# Patient Record
Sex: Male | Born: 1970 | Marital: Married | Smoking: Never smoker
Health system: Southern US, Community
[De-identification: ages and names within clinical notes are randomized; demographics above are authoritative.]

---

## 2014-02-26 ENCOUNTER — Encounter (HOSPITAL_COMMUNITY): Payer: Self-pay | Admitting: Emergency Medicine

## 2014-02-26 ENCOUNTER — Emergency Department: Payer: Self-pay | Admitting: Emergency Medicine

## 2014-02-26 DIAGNOSIS — R091 Pleurisy: Secondary | ICD-10-CM | POA: Diagnosis present

## 2014-02-26 DIAGNOSIS — Z86718 Personal history of other venous thrombosis and embolism: Secondary | ICD-10-CM

## 2014-02-26 DIAGNOSIS — R000 Tachycardia, unspecified: Secondary | ICD-10-CM | POA: Diagnosis present

## 2014-02-26 DIAGNOSIS — Z720 Tobacco use: Secondary | ICD-10-CM

## 2014-02-26 DIAGNOSIS — J9811 Atelectasis: Secondary | ICD-10-CM | POA: Diagnosis present

## 2014-02-26 DIAGNOSIS — R0902 Hypoxemia: Secondary | ICD-10-CM | POA: Diagnosis present

## 2014-02-26 DIAGNOSIS — I2699 Other pulmonary embolism without acute cor pulmonale: Principal | ICD-10-CM | POA: Diagnosis present

## 2014-02-26 LAB — COMPREHENSIVE METABOLIC PANEL
ALT: 18 U/L
AST: 18 U/L (ref 15–37)
Albumin: 3.7 g/dL (ref 3.4–5.0)
Alkaline Phosphatase: 63 U/L
Anion Gap: 8 (ref 7–16)
BILIRUBIN TOTAL: 0.5 mg/dL (ref 0.2–1.0)
BUN: 11 mg/dL (ref 7–18)
CO2: 27 mmol/L (ref 21–32)
CREATININE: 0.92 mg/dL (ref 0.60–1.30)
Calcium, Total: 8.8 mg/dL (ref 8.5–10.1)
Chloride: 103 mmol/L (ref 98–107)
EGFR (African American): >60
EGFR (Non-African Amer.): >60
Glucose: 95 mg/dL (ref 65–99)
OSMOLALITY: 275 (ref 275–301)
Potassium: 3.9 mmol/L (ref 3.5–5.1)
SODIUM: 138 mmol/L (ref 136–145)
Total Protein: 7.8 g/dL (ref 6.4–8.2)

## 2014-02-26 LAB — CBC
HCT: 42.9 % (ref 40.0–52.0)
HGB: 13.9 g/dL (ref 13.0–18.0)
MCH: 30.6 pg (ref 26.0–34.0)
MCHC: 32.4 g/dL (ref 32.0–36.0)
MCV: 95 fL (ref 80–100)
Platelet: 285 10*3/uL (ref 150–440)
RBC: 4.53 10*6/uL (ref 4.40–5.90)
RDW: 13 % (ref 11.5–14.5)
WBC: 11.5 10*3/uL — AB (ref 3.8–10.6)

## 2014-02-26 LAB — TROPONIN I

## 2014-02-26 LAB — APTT: ACTIVATED PTT: 28.5 s (ref 23.6–35.9)

## 2014-02-26 LAB — PROTIME-INR
INR: 1.1
Prothrombin Time: 13.8 secs (ref 11.5–14.7)

## 2014-02-26 NOTE — ED Notes (Signed)
Patient arrives with ongoing chest pain after being discharged from [facility]. States that he was there for about 8 hours and was extensively evaluated for the chest pain, but it continues and has gotten worse. Endorses previous history of DVT, and patient is a Naval architecttruck driver. Denies lower extremity pain and explains that a CT-A was completed by [facility] during his visit to rule out PE. States that pain increases with walking, deep breath, and laying flat. Palpation does not increase pain.

## 2014-02-27 ENCOUNTER — Encounter (HOSPITAL_COMMUNITY): Payer: Self-pay | Admitting: Internal Medicine

## 2014-02-27 ENCOUNTER — Inpatient Hospital Stay (HOSPITAL_COMMUNITY): Payer: Self-pay

## 2014-02-27 ENCOUNTER — Inpatient Hospital Stay (HOSPITAL_COMMUNITY)
Admission: EM | Admit: 2014-02-27 | Discharge: 2014-03-01 | DRG: 176 | Disposition: A | Discharge status: Home / Self Care | Payer: Self-pay | Attending: Internal Medicine | Admitting: Internal Medicine

## 2014-02-27 DIAGNOSIS — R000 Tachycardia, unspecified: Secondary | ICD-10-CM

## 2014-02-27 DIAGNOSIS — R091 Pleurisy: Secondary | ICD-10-CM

## 2014-02-27 DIAGNOSIS — R0902 Hypoxemia: Secondary | ICD-10-CM

## 2014-02-27 DIAGNOSIS — IMO0001 Reserved for inherently not codable concepts without codable children: Secondary | ICD-10-CM

## 2014-02-27 DIAGNOSIS — F17200 Nicotine dependence, unspecified, uncomplicated: Secondary | ICD-10-CM | POA: Diagnosis present

## 2014-02-27 DIAGNOSIS — Z86718 Personal history of other venous thrombosis and embolism: Secondary | ICD-10-CM

## 2014-02-27 DIAGNOSIS — J811 Chronic pulmonary edema: Secondary | ICD-10-CM | POA: Insufficient documentation

## 2014-02-27 DIAGNOSIS — I2699 Other pulmonary embolism without acute cor pulmonale: Principal | ICD-10-CM

## 2014-02-27 LAB — APTT
APTT: 56 s — AB (ref 24–37)
aPTT: 45 seconds — ABNORMAL HIGH (ref 24–37)

## 2014-02-27 LAB — BASIC METABOLIC PANEL
Anion gap: 13 (ref 5–15)
BUN: 15 mg/dL (ref 6–23)
CO2: 26 mEq/L (ref 19–32)
Calcium: 9 mg/dL (ref 8.4–10.5)
Chloride: 99 mEq/L (ref 96–112)
Creatinine, Ser: 0.87 mg/dL (ref 0.50–1.35)
GFR calc Af Amer: >90 mL/min (ref >90)
GFR calc non Af Amer: >90 mL/min (ref >90)
Glucose, Bld: 114 mg/dL — ABNORMAL HIGH (ref 70–99)
Potassium: 4.9 mEq/L (ref 3.7–5.3)
Sodium: 138 mEq/L (ref 137–147)

## 2014-02-27 LAB — CBC
HCT: 39.2 % (ref 39.0–52.0)
Hemoglobin: 13.4 g/dL (ref 13.0–17.0)
MCH: 31.5 pg (ref 26.0–34.0)
MCHC: 34.2 g/dL (ref 30.0–36.0)
MCV: 92 fL (ref 78.0–100.0)
Platelets: 285 10*3/uL (ref 150–400)
RBC: 4.26 MIL/uL (ref 4.22–5.81)
RDW: 12.9 % (ref 11.5–15.5)
WBC: 12.7 10*3/uL — ABNORMAL HIGH (ref 4.0–10.5)

## 2014-02-27 LAB — BLOOD GAS, ARTERIAL
Acid-Base Excess: 1.7 mmol/L (ref 0.0–2.0)
BICARBONATE: 25.9 meq/L — AB (ref 20.0–24.0)
Drawn by: 275531
O2 Content: 2 L/min
O2 Saturation: 96.6 %
PATIENT TEMPERATURE: 98.6
TCO2: 27.1 mmol/L (ref 0–100)
pCO2 arterial: 41.3 mmHg (ref 35.0–45.0)
pH, Arterial: 7.413 (ref 7.350–7.450)
pO2, Arterial: 86.8 mmHg (ref 80.0–100.0)

## 2014-02-27 LAB — TROPONIN I: Troponin I: <0.3 ng/mL (ref <0.30)

## 2014-02-27 LAB — ANTITHROMBIN III: AntiThromb III Func: 88 % (ref 75–120)

## 2014-02-27 LAB — PRO B NATRIURETIC PEPTIDE: Pro B Natriuretic peptide (BNP): 38.1 pg/mL (ref 0–125)

## 2014-02-27 LAB — HEPARIN LEVEL (UNFRACTIONATED)
HEPARIN UNFRACTIONATED: 1.09 [IU]/mL — AB (ref 0.30–0.70)
Heparin Unfractionated: 0.34 IU/mL (ref 0.30–0.70)

## 2014-02-27 LAB — PROCALCITONIN: Procalcitonin: <0.1 ng/mL

## 2014-02-27 LAB — HOMOCYSTEINE: HOMOCYSTEINE-NORM: 9.6 umol/L (ref 4.0–15.4)

## 2014-02-27 LAB — LACTIC ACID, PLASMA: Lactic Acid, Venous: 2.7 mmol/L — ABNORMAL HIGH (ref 0.5–2.2)

## 2014-02-27 LAB — I-STAT TROPONIN, ED: Troponin i, poc: 0 ng/mL (ref 0.00–0.08)

## 2014-02-27 MED ORDER — SODIUM CHLORIDE 0.9 % IV SOLN
INTRAVENOUS | Status: DC
  Administered 2014-02-27 (×2): via INTRAVENOUS

## 2014-02-27 MED ORDER — HYDROMORPHONE HCL 1 MG/ML IJ SOLN: 2.0000 mg | INTRAMUSCULAR | Status: DC | PRN

## 2014-02-27 MED ORDER — HYDROCODONE-ACETAMINOPHEN 10-325 MG PO TABS: 1.0000 | ORAL_TABLET | ORAL | Status: DC | PRN

## 2014-02-27 MED ORDER — HEPARIN (PORCINE) IN NACL 100-0.45 UNIT/ML-% IJ SOLN
2000.0000 [IU]/h | INTRAMUSCULAR | Status: DC
  Administered 2014-02-27: 1500 [IU]/h via INTRAVENOUS
  Administered 2014-02-27: 1800 [IU]/h via INTRAVENOUS
  Filled 2014-02-27 (×3): qty 250

## 2014-02-27 MED ORDER — MORPHINE SULFATE 2 MG/ML IJ SOLN: 2.0000 mg | INTRAMUSCULAR | Status: DC | PRN

## 2014-02-27 MED ORDER — MORPHINE SULFATE 4 MG/ML IJ SOLN
4.0000 mg | Freq: Once | INTRAMUSCULAR | Status: AC
  Administered 2014-02-27: 4 mg via INTRAVENOUS
  Filled 2014-02-27: qty 1

## 2014-02-27 MED ORDER — SODIUM CHLORIDE 0.9 % IJ SOLN
3.0000 mL | Freq: Two times a day (BID) | INTRAMUSCULAR | Status: DC
  Administered 2014-02-28: 3 mL via INTRAVENOUS

## 2014-02-27 MED ORDER — ACETAMINOPHEN 325 MG PO TABS: 650.0000 mg | ORAL_TABLET | Freq: Four times a day (QID) | ORAL | Status: DC | PRN

## 2014-02-27 MED ORDER — NICOTINE 21 MG/24HR TD PT24
21.0000 mg | MEDICATED_PATCH | Freq: Every day | TRANSDERMAL | Status: DC
Start: 2014-02-27 — End: 2014-03-01
  Administered 2014-02-27 – 2014-02-28 (×2): 21 mg via TRANSDERMAL
  Filled 2014-02-27 (×3): qty 1

## 2014-02-27 MED ORDER — ACETAMINOPHEN 650 MG RE SUPP
650.0000 mg | Freq: Four times a day (QID) | RECTAL | Status: DC | PRN
Start: 2014-02-27 — End: 2014-03-01

## 2014-02-27 MED ORDER — HEPARIN BOLUS VIA INFUSION
2000.0000 [IU] | Freq: Once | INTRAVENOUS | Status: AC
  Administered 2014-02-27: 2000 [IU] via INTRAVENOUS
  Filled 2014-02-27: qty 2000

## 2014-02-27 MED ORDER — MORPHINE SULFATE ER 15 MG PO TBCR
15.0000 mg | EXTENDED_RELEASE_TABLET | Freq: Two times a day (BID) | ORAL | Status: DC
  Administered 2014-02-27 – 2014-02-28 (×4): 15 mg via ORAL
  Filled 2014-02-27 (×4): qty 1

## 2014-02-27 NOTE — ED Notes (Signed)
Contacted ED secretary Victorino DikeJennifer to request records of tests conducted at [facility] today.

## 2014-02-27 NOTE — ED Notes (Signed)
Pt has discharge papers from [facility] for Pulmonary Embolus. Pt states he was discharge with a perscription for xarelto and Percocet. Pt is c/o of SOB on inspiration. Pt states that he has to take shallow breaths.

## 2014-02-27 NOTE — ED Notes (Signed)
Per Dr. Norlene Campbelltter, pt given water

## 2014-02-27 NOTE — H&P (Signed)
[facility] History and Physical  Robert CoonsStoyan G Ford ZOX:[id number] DOB: 1970/09/11 DOA: 02/27/2014  Referring physician: ED physician PCP: No primary provider on file.  Specialists:   Chief Complaint: Worsening shortness of breath and chest pain.   HPI: Robert Ford is a 43 y.o. male With PMH of left leg DVT, smoking, newly diagnosed PE, who presents with worsening shortness of breath and chest pain.  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 He had a DVT in left leg 4 years ago, which was treated with Coumadin for 5 months. 4 days ago, he felt pressure in the calf area over his left leg, but he did not pay attention to it. 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 Yesterday morning, he started having severe chest pain or shortness of breath. He did not have cough. His chest pain was 10 out of 10 in severity, pruritic, located at the left lower back and chest. It aggravated by deep breathing and movement. He was brought [facility]. He was diagnosed with pulmonary embolism. He was treated with heparin and given one dose of Xarelto in ED, then discharged on Xarelto and pain medications. In the afternoon, he felt that his chest pain and SOB have been getting worse. Therefore he comes to the emergency room for further evaluation and treatment.  Of note, [facility] showed pulmonary embolism without evidence of right heart straining. Likely small pulmonary infarcts in left lower lobe is identified. Lower extremity venous Doppler ultrasound was done, which did not show DVT. Patient has tachycardia, normal blood pressure in ED today. His O2 saturation decreased to 80% on ambulation. He is admitted to inpatient for further evaluation and treatment.   Review of Systems: As presented in the history of presenting illness, rest negative.  Where does patient live?   Lives [place] in [place] home Can patient participate in ADLs? Yes  Allergy: No Known Allergies  History reviewed. No pertinent past medical history.  History reviewed. No pertinent past surgical history.  Social History:  reports that he has never smoked. He does not have any smokeless tobacco history on file. He reports that he does not drink alcohol or use illicit drugs.  Family History:  Family History  Problem Relation Age of Onset  . Stroke Father      Prior to Admission medications   Not on File    Physical Exam: Filed Vitals:   02/27/14 0430 02/27/14 0500 02/27/14 0527 02/27/14 0530  BP: 116/80 113/77  112/75  Pulse: 100 104  107  Temp:      TempSrc:      Resp: 25 18  27   Height:   6\' 1"  (1.854 m)   Weight:   95.255 kg (210 lb)   SpO2: 95% 94%  95%   General: Moderately distressed.  HEENT:       Eyes: PERRL, EOMI, no scleral icterus       ENT: No discharge from the ears and nose, no pharynx injection, no tonsillar enlargement.        Neck: No JVD, no bruit, no mass felt. Cardiac: S1/S2, RRR, No murmurs, gallops or rubs Pulm: decreased air movement bilaterally due to pain. Clear to auscultation bilaterally. No rales, wheezing, rhonchi or rubs. Abd: Soft, nondistended, nontender, no rebound pain, no organomegaly, BS present Ext: No edema. 2+DP/PT pulse bilaterally Musculoskeletal: No joint deformities, erythema,  or stiffness, ROM full Skin: No rashes.  Neuro: Alert and oriented X3, cranial nerves II-XII grossly intact, muscle strength 5/5 in all extremeties, sensation to light touch intact. B Psych: Patient is not psychotic, no suicidal or hemocidal ideation.  Labs on Admission:  Basic Metabolic Panel:  Recent Labs Lab 02/26/14 2340  NA 138  K 4.9  CL 99  CO2 26  GLUCOSE 114*  BUN 15  CREATININE 0.87  CALCIUM 9.0   Liver Function Tests: No results found for this basename: AST, ALT, ALKPHOS, BILITOT, PROT, ALBUMIN,  in the last 168 hours No  results found for this basename: LIPASE, AMYLASE,  in the last 168 hours No results found for this basename: AMMONIA,  in the last 168 hours CBC:  Recent Labs Lab 02/26/14 2340  WBC 12.7*  HGB 13.4  HCT 39.2  MCV 92.0  PLT 285   Cardiac Enzymes: No results found for this basename: CKTOTAL, CKMB, CKMBINDEX, TROPONINI,  in the last 168 hours  BNP (last 3 results) No results found for this basename: PROBNP,  in the last 8760 hours CBG: No results found for this basename: GLUCAP,  in the last 168 hours  Radiological Exams on Admission: No results found.  EKG: Independently reviewed.   Assessment/Plan Principal Problem:   Pulmonary embolism Active Problems:   Smoking   History of DVT of lower extremity  1. pulmonary embolism: Patient was diagnosed with pulmonary embolism by CTA yesterday. He received one dose of Xarelto after Heparin treatment. The reason why his chest pain and SOB getting worse is not clear. He still has tachycardia and oxygen desaturation on ambulation. Two possibilities needs to be considered, including thrombus enlargement and right heart straining.   -will admit to tele - 2D echo to evaluate right heart function, if R heart is strained, may need to repeat CTA - switch Xarelto to Heparin gtt. In case he needs a procedure if his PE is enlarged. - pain control: morphine prn and MS- contin bid. - check homocystine level, factor V Leyden, thrombin III  2. Smoking: 1.5 pack a day for more than 20 years. - nicotine patch - Counseled the patient about the importance of quitting smoke  DVT ppx: on  Heparin gtt  Code Status: Full code Family Communication: Yes, patient's friend at bed side Disposition Plan: Admit to inpatient   Date of Service 02/27/2014    Robert HarpIU, Robert Ford [facility] Pager [phone]  If 7PM-7AM, please contact night-coverage [email or web address] Password [email or web address] 02/27/2014, 5:46 AM

## 2014-02-27 NOTE — Progress Notes (Signed)
[facility] PROGRESS NOTE  Robert Ford ZOX:[id number] DOB: 12/10/1970 DOA: 02/27/2014 PCP: No primary provider on file.  Assessment/Plan: pulmonary embolism:   diagnosed with pulmonary embolism by CTA at [facility] on 10/8 .  He received one dose of Xarelto after Heparin treatment and discharged home.  CT mentions small left lower lung blood clot without right heart strain.patient still hypoxic and tachycardic. Possibly pleuritic chest pain related to pulmonary infarction and atelectasis.  - 2D echo to evaluate right heart function, if R heart is strained, may need  repeat CTA . ABG normal on 2L [place] - Placed on Heparin gtt.  - pain control with dilaudid prn, , vicodin and  MS- contin bid.  - check homocystine level, factor V Leyden, thrombin III ( may not show clear resumt as [facility] si [facility] has hx of DVT 4 years back [facility] [place] [facility].  doppler LE done at [facility] negative for DVT. -check serial troponin, elevated lactate. CXR showing left atalectasis. -pulmonary consult if symptoms not improved      Tobacco use 2 PPD  for more than 20 years.  - nicotine patch ordered -  DVT ppx: on Heparin gtt    Code Status: Full code  Family Communication: none Disposition Plan: monitor on tele      Code Status: full code Family Communication: none Disposition Plan: home once improved   Consultants:  None   Procedures:  2D echo  Antibiotics:  NONE  HPI/Subjective: Pt complains of left lower back pain on minimal movement and LUQ pain on standing up. Became hypoxic to 82 on minimal exertion on RA  Objective: Filed Vitals:   02/27/14 1324  BP: 107/76  Pulse: 102  Temp: 100.1 F (37.8 C)  Resp: 20   No intake or output data in the 24 hours ending 02/27/14 1439 Filed Weights   02/27/14 0527 02/27/14 0652  Weight: 95.255 kg (210 lb) 104.055 kg (229 lb 6.4 oz)    Exam:   General:  In some distress with pain  Chest: diminished  breath sounds on left, tender to pressure over left lower back  Cardiovascular: NS1&S2, no murmurs  Abdomen: soft, ND, LUQ tenderness to pressure( left subcostal area), BS+  Musculoskeletal: war, no edema    Data Reviewed: Basic Metabolic Panel:  Recent Labs Lab 02/26/14 2340  NA 138  K 4.9  CL 99  CO2 26  GLUCOSE 114*  BUN 15  CREATININE 0.87  CALCIUM 9.0   Liver Function Tests: No results found for this basename: AST, ALT, ALKPHOS, BILITOT, PROT, ALBUMIN,  in the last 168 hours No results found for this basename: LIPASE, AMYLASE,  in the last 168 hours No results found for this basename: AMMONIA,  in the last 168 hours CBC:  Recent Labs Lab 02/26/14 2340  WBC 12.7*  HGB 13.4  HCT 39.2  MCV 92.0  PLT 285   Cardiac Enzymes:  Recent Labs Lab 02/27/14 1129  TROPONINI <0.30   BNP (last 3 results)  Recent Labs  02/27/14 1229  PROBNP 38.1   CBG: No results found for this basename: GLUCAP,  in the last 168 hours  No results found for this or any previous visit (from the past 240 hour(s)).   Studies: Dg Chest Port 1 View  02/27/2014   CLINICAL DATA:  Hypoxia  EXAM: PORTABLE CHEST - 1 VIEW  COMPARISON:  CT scan of the chest 02/26/2014  FINDINGS: Low inspiratory volumes with left greater than  right bibasilar patchy airspace opacities and evidence of a superimposed volume loss. Is difficult to exclude a small left pleural effusion. The cardiac and mediastinal contours remain unchanged. No overt pulmonary edema. No acute osseous abnormality.  IMPRESSION: Very low inspiratory volumes with left greater than right bibasilar opacities favored to represent atelectasis. In the setting of acute pulmonary embolus, superimposed pulmonary infarction/reperfusion alveolar hemorrhage is difficult to exclude entirely.  Possible small left pleural effusion.   Electronically Signed   By: Malachy MoanHeath  McCullough M.D.   On: 02/27/2014 09:15    Scheduled Meds: . morphine  15 mg Oral Q12H   . nicotine  21 mg Transdermal Daily  . sodium chloride  3 mL Intravenous Q12H   Continuous Infusions: . sodium chloride 75 mL/hr at 02/27/14 0715  . heparin 1,500 Units/hr (02/27/14 0604)      Time spent: 25 minutes    Carolann Brazell  [facility] Pager [phone] If 7PM-7AM, please contact night-coverage at [email or web address], password [email or web address] 02/27/2014, 2:39 PM  LOS: 0 days

## 2014-02-27 NOTE — ED Provider Notes (Signed)
CSN: [id number]     Arrival date & time 02/26/14  2323 History   First MD Initiated Contact with Patient 02/27/14 0357     Chief Complaint  Patient presents with  . Chest Pain     (Consider location/radiation/quality/duration/timing/severity/associated sxs/prior Treatment) HPI 43 yo male presents to the ER with complaint of left sided chest pain, pain with deep breathing, and recent dx of PE.  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  He reports h/o DVT 4 years ago tx with coumadin for 4 years.  Pt frequently spends up to 10 hour driving.  Monday he had fullness and pain to left leg.  Sxs resolved the next day, developed SOB on Wednesday.  [facility], peripheral dopplers negative, PE study with PE, left pleural effusion, and pulmonary infarct.  No nursing or doctor notes available.  Pt received heparin bolus, xarelto, and d/c with xarelto and percocet.  Pt reports he is unable to take a deep breath due to pain.  He gets easily winded.  Pt brought in by his sister in law who is a NP from [place].  Pt is unable to get home to [place] until his wife drives down.  He is unable to f/u with his doctor until next Thursday, and has a walk in appointment on Tuesday in [place].   History reviewed. No pertinent past medical history. History reviewed. No pertinent past surgical history. History reviewed. No pertinent family history. History  Substance Use Topics  . Smoking status: Never Smoker   . Smokeless tobacco: Not on file  . Alcohol Use: No    Review of Systems  All other systems reviewed and are negative.  Other than listed in hpi   Allergies  Review of patient's allergies indicates no known allergies.  Home Medications   Prior to Admission medications   Not on File   BP 115/77  Pulse 100  Temp(Src) 98.4 F (36.9 C) (Oral)  Resp 12  SpO2 94% Physical Exam  Nursing note and vitals reviewed. Constitutional: He is oriented to person, place, and time. He appears  well-developed and well-nourished. He appears distressed.  HENT:  Head: Normocephalic and atraumatic.  Nose: Nose normal.  Mouth/Throat: Oropharynx is clear and moist.  Eyes: Conjunctivae and EOM are normal. Pupils are equal, round, and reactive to light.  Neck: Normal range of motion. Neck supple. No JVD present. No tracheal deviation present. No thyromegaly present.  Cardiovascular: Regular rhythm, normal heart sounds and intact distal pulses.  Exam reveals no gallop and no friction rub.   No murmur heard. tachycardia  Pulmonary/Chest: Breath sounds normal. No stridor. He is in respiratory distress. He has no wheezes. He has no rales. He exhibits no tenderness.  Tachypnea, splinting, shallow respirations.  Sats drop into mid 80s with ambulation off O2  Abdominal: Soft. Bowel sounds are normal. He exhibits no distension and no mass. There is no tenderness. There is no rebound and no guarding.  Musculoskeletal: Normal range of motion. He exhibits no edema and no tenderness.  Lymphadenopathy:    He has no cervical adenopathy.  Neurological: He is alert and oriented to person, place, and time. He displays normal reflexes. He exhibits normal muscle tone. Coordination normal.  Skin: Skin is warm and dry. No rash noted. No erythema. No pallor.  Psychiatric: He has a normal mood and affect. His behavior is normal. Judgment and thought content normal.    ED Course  Procedures (including critical care time) Labs Review Labs Reviewed  CBC - Abnormal; Notable for the following:    WBC 12.7 (*)    All other components within normal limits  BASIC METABOLIC PANEL - Abnormal; Notable for the following:    Glucose, Bld 114 (*)    All other components within normal limits  ANTITHROMBIN III  HEPARIN LEVEL (UNFRACTIONATED)  APTT  HOMOCYSTEINE  FACTOR 5 LEIDEN  I-STAT TROPOININ, ED    Imaging Review No results found.   EKG Interpretation   Date/Time:  Thursday February 26 2014 23:32:37  EDT Ventricular Rate:  100 PR Interval:  148 QRS Duration: 100 QT Interval:  352 QTC Calculation: 454 R Axis:   52 Text Interpretation:  Sinus tachycardia Possible Left atrial enlargement  Borderline ECG Confirmed by Tracia Lacomb  MD, Aprill Banko ([id number]) on 02/27/2014 5:08:27  AM      MDM   Final diagnoses:  Pulmonary embolism and infarction  Hypoxia    43 yo male dx earlier this evening with PE, left pleural effusion and left pulmonary infarct with persistent pain, dyspnea and hypoxia.  D/w hospitalist for admisson.    Olivia Mackielga M Tenoch Mcclure, MD 02/27/14 [phone]

## 2014-02-27 NOTE — ED Notes (Signed)
Admitting MD at bedside.

## 2014-02-27 NOTE — Plan of Care (Signed)
Problem: Consults Goal: Diagnosis - Venous Thromboembolism (VTE) Choose a selection PE (Pulmonary Embolism)     

## 2014-02-27 NOTE — Progress Notes (Signed)
*  PRELIMINARY RESULTS* Echocardiogram 2D Echocardiogram has been performed.  Jeryl ColumbiaLLIOTT, Cherell Colvin 02/27/2014, 3:23 PM

## 2014-02-27 NOTE — ED Notes (Signed)
Pt was in pain when walking. Pt SPO2 level was between 86-91 and pulse rate elevated (110-115) while pt was ambulated.

## 2014-02-27 NOTE — Progress Notes (Addendum)
ANTICOAGULATION CONSULT NOTE - Initial Consult  Pharmacy Consult for heparin Indication: pulmonary embolus  No Known Allergies  Patient Measurements: Height: 6\' 1"  (185.4 cm) Weight: 210 lb (95.255 kg) IBW/kg (Calculated) : 79.9  Vital Signs: Temp: 98.4 F (36.9 C) (10/09 0404) Temp Source: Oral (10/09 0404) BP: 113/77 mmHg (10/09 0500) Pulse Rate: 104 (10/09 0500)  Labs:  Recent Labs  02/26/14 2340  HGB 13.4  HCT 39.2  PLT 285  CREATININE 0.87    Estimated Creatinine Clearance: 126.3 ml/min (by C-G formula based on Cr of 0.87).   Medical History: History reviewed. No pertinent past medical history.   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  Goal of Therapy:  Heparin level 0.3-0.7 units/ml Monitor platelets by anticoagulation protocol: Yes   Plan:  Will give small heparin bolus of 2000 units followed by gtt at 1500 units/hr and monitor heparin levels (will also obtain at least one PTT to evaluate whether Xarelto has fully cleared) and CBC.  Robert Ford, PharmD, BCPS  02/27/2014,5:28 AM

## 2014-02-27 NOTE — Progress Notes (Signed)
ANTICOAGULATION CONSULT NOTE - Follow Up Consult  Pharmacy Consult for Heparin Indication: DVT/PE  No Known Allergies  Patient Measurements: Height: 6\' 1"  (185.4 cm) Weight: 229 lb 6.4 oz (104.055 kg) IBW/kg (Calculated) : 79.9 Heparin Dosing Weight: 102 kg  Vital Signs: Temp: 100.1 F (37.8 C) (10/09 1324) Temp Source: Oral (10/09 1324) BP: 107/76 mmHg (10/09 1324) Pulse Rate: 102 (10/09 1324)  Labs:  Recent Labs  02/26/14 2340 02/27/14 1229  HGB 13.4  --   HCT 39.2  --   PLT 285  --   APTT  --  45*  HEPARINUNFRC  --  1.09*  CREATININE 0.87  --     Estimated Creatinine Clearance: 141.6 ml/min (by C-G formula based on Cr of 0.87).  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 4d ago pt had similar calf pain. Yes[facility] where CT revealed PE. Given one dose of Xarelto + Rx for Xarelto BID, [facility] c/o worsening CP/SOB despite Xarelto, now to be admitted and started on heparin w/ possible interventions.  Anticoagulation: new repeat DVT/PE: Heparin level 1.09 (elevated from Eliquis), aPTT 45.  Pulmonary: Tobacco 1.5ppd  Goal of Therapy:  aPTT 66-102 seconds Monitor platelets by anticoagulation protocol: Yes   Plan:  Increase IV heparin to 1800 units/hr Recheck aptt in 6 hrs.    Ioan Landini S. Merilynn Finlandobertson, PharmD, BCPS Clinical Staff Pharmacist Pager [phone]  Misty Stanleyobertson, Codey Burling Stillinger 02/27/2014,1:25 PM

## 2014-02-27 NOTE — Progress Notes (Signed)
ANTICOAGULATION CONSULT NOTE - Follow Up Consult  Pharmacy Consult for heparin Indication: pulmonary embolus  Labs:  Recent Labs  02/26/14 2340 02/27/14 1129 02/27/14 1229 02/27/14 1823 02/27/14 2025 02/27/14 2257  HGB 13.4  --   --   --   --   --   HCT 39.2  --   --   --   --   --   PLT 285  --   --   --   --   --   APTT  --   --  45*  --   --  56*  HEPARINUNFRC  --   --  1.09*  --  0.34  --   CREATININE 0.87  --   --   --   --   --   TROPONINI  --  <0.30  --  <0.30  --   --     Assessment: 43yo male remains subtherapeutic on heparin after rate increase; heparin level still being slightly affected by Xarelto, PTT under goal.  Goal of Therapy:  Heparin level 0.3-0.7 units/ml aPTT 66-102 seconds   Plan:  Will increase heparin gtt by 2 units/kg/hr to 2000 units/hr and check level/PTT in 6hr.  Vernard GamblesVeronda Dewitte Vannice, PharmD, BCPS  02/27/2014,11:35 PM

## 2014-02-27 NOTE — Progress Notes (Signed)
UR complete.  Jaishawn Witzke RN, MSN 

## 2014-02-28 DIAGNOSIS — R000 Tachycardia, unspecified: Secondary | ICD-10-CM | POA: Diagnosis present

## 2014-02-28 DIAGNOSIS — Z720 Tobacco use: Secondary | ICD-10-CM

## 2014-02-28 LAB — HEPARIN LEVEL (UNFRACTIONATED): HEPARIN UNFRACTIONATED: 0.33 [IU]/mL (ref 0.30–0.70)

## 2014-02-28 LAB — CBC
HCT: 36.9 % — ABNORMAL LOW (ref 39.0–52.0)
HEMOGLOBIN: 12.3 g/dL — AB (ref 13.0–17.0)
MCH: 31.2 pg (ref 26.0–34.0)
MCHC: 33.3 g/dL (ref 30.0–36.0)
MCV: 93.7 fL (ref 78.0–100.0)
Platelets: 254 10*3/uL (ref 150–400)
RBC: 3.94 MIL/uL — AB (ref 4.22–5.81)
RDW: 12.7 % (ref 11.5–15.5)
WBC: 12.9 10*3/uL — ABNORMAL HIGH (ref 4.0–10.5)

## 2014-02-28 LAB — COMPREHENSIVE METABOLIC PANEL
ALT: 7 U/L (ref 0–53)
ANION GAP: 15 (ref 5–15)
AST: 11 U/L (ref 0–37)
Albumin: 3 g/dL — ABNORMAL LOW (ref 3.5–5.2)
Alkaline Phosphatase: 50 U/L (ref 39–117)
BUN: 11 mg/dL (ref 6–23)
CALCIUM: 8.6 mg/dL (ref 8.4–10.5)
CO2: 23 meq/L (ref 19–32)
Chloride: 100 mEq/L (ref 96–112)
Creatinine, Ser: 0.87 mg/dL (ref 0.50–1.35)
GLUCOSE: 112 mg/dL — AB (ref 70–99)
Potassium: 4.6 mEq/L (ref 3.7–5.3)
Sodium: 138 mEq/L (ref 137–147)
Total Bilirubin: 0.3 mg/dL (ref 0.3–1.2)
Total Protein: 6.7 g/dL (ref 6.0–8.3)

## 2014-02-28 LAB — APTT: aPTT: 75 seconds — ABNORMAL HIGH (ref 24–37)

## 2014-02-28 MED ORDER — RIVAROXABAN 15 MG PO TABS
15.0000 mg | ORAL_TABLET | Freq: Two times a day (BID) | ORAL | Status: DC
  Administered 2014-02-28 – 2014-03-01 (×3): 15 mg via ORAL
  Filled 2014-02-28 (×5): qty 1

## 2014-02-28 MED ORDER — RIVAROXABAN 20 MG PO TABS: 20.0000 mg | ORAL_TABLET | Freq: Every day | ORAL | Status: DC

## 2014-02-28 MED ORDER — RIVAROXABAN (XARELTO) EDUCATION KIT FOR DVT/PE PATIENTS
PACK | Freq: Once | Status: AC
  Administered 2014-02-28: 12:00:00
  Filled 2014-02-28: qty 1

## 2014-02-28 MED ORDER — RIVAROXABAN 15 MG PO TABS
15.0000 mg | ORAL_TABLET | Freq: Two times a day (BID) | ORAL | Status: DC
  Filled 2014-02-28 (×2): qty 1

## 2014-02-28 NOTE — Progress Notes (Signed)
[facility] PROGRESS NOTE  Robert CoonsStoyan G Ford ZOX:[id number] DOB: 12/14/1970 DOA: 02/27/2014 PCP: No primary provider on file.  Assessment/Plan: pulmonary embolism:   diagnosed with pulmonary embolism by CTA at [facility] on 10/8 .  He received one dose of Xarelto after Heparin treatment and discharged home.  CT mentions small left lower lung blood clot without right heart strain. Possibly pleuritic chest pain related to pulmonary infarction and atelectasis.  - 2D echo with normal EF and no Wall motion abnormality or rt heart strain.  chest ain resolved, still  tachycardic but not dyspneic anymore and sats stable on RA. -check 12 lead EKG. Will d/c IV heparin and start on xarelto - prn pain control -  factor V Ley[place] pending, homocystine level and thrombin III negative. ( may not show clear resumt as [facility] has hx of DVT 4 years back [facility] [place] [facility].  doppler LE done at [place] negative for DVT. -check serial troponin negative , elevated lactate. CXR showing left atalectasis.      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 counseled on cessation - nicotine patch ordered -  DVT ppx: on Heparin gtt , transition to xarelto   Code Status: Full code  Family Communication: wife at bedside Disposition Plan: monitor on tele . D/c home in am if continues to improve     Code Status: full code Family Communication: none Disposition Plan: home once improved   Consultants:  None   Procedures:  2D echo  Antibiotics:  NONE  HPI/Subjective: Chest pain resolved. appears very comfortable. Tachy on monitor from 100-120s. sats normal on RA  Objective: Filed Vitals:   02/28/14 0546  BP: 107/70  Pulse: 104  Temp: 99.2 F (37.3 C)  Resp: 18    Intake/Output Summary (Last 24 hours) at 02/28/14 0908 Last data filed at 02/27/14 1700  Gross per 24 hour  Intake    360 ml  Output      0 ml  Net    360 ml   Filed Weights   02/27/14  0527 02/27/14 0652 02/28/14 0546  Weight: 95.255 kg (210 lb) 104.055 kg (229 lb 6.4 oz) 103.964 kg (229 lb 3.2 oz)    Exam:   General: NAD  Chest: Clear b/l  Cardiovascular: s1&s2 tachycardic, no murmurs  Abdomen: soft, ND, NT, BS+  Musculoskeletal: warm, no edema    Data Reviewed: Basic Metabolic Panel:  Recent Labs Lab 02/26/14 2340 02/28/14 0226  NA 138 138  K 4.9 4.6  CL 99 100  CO2 26 23  GLUCOSE 114* 112*  BUN 15 11  CREATININE 0.87 0.87  CALCIUM 9.0 8.6   Liver Function Tests:  Recent Labs Lab 02/28/14 0226  AST 11  ALT 7  ALKPHOS 50  BILITOT 0.3  PROT 6.7  ALBUMIN 3.0*   No results found for this basename: LIPASE, AMYLASE,  in the last 168 hours No results found for this basename: AMMONIA,  in the last 168 hours CBC:  Recent Labs Lab 02/26/14 2340 02/28/14 0226  WBC 12.7* 12.9*  HGB 13.4 12.3*  HCT 39.2 36.9*  MCV 92.0 93.7  PLT 285 254   Cardiac Enzymes:  Recent Labs Lab 02/27/14 1129 02/27/14 1823 02/27/14 2257  TROPONINI <0.30 <0.30 <0.30   BNP (last 3 results)  Recent Labs  02/27/14 1229  PROBNP 38.1   CBG: No results found for this basename: GLUCAP,  in the last 168 hours  No results found for this or any previous visit (from the past 240 hour(s)).   Studies: Dg Chest Port 1 View  02/27/2014   CLINICAL DATA:  Hypoxia  EXAM: PORTABLE CHEST - 1 VIEW  COMPARISON:  CT scan of the chest 02/26/2014  FINDINGS: Low inspiratory volumes with left greater than right bibasilar patchy airspace opacities and evidence of a superimposed volume loss. Is difficult to exclude a small left pleural effusion. The cardiac and mediastinal contours remain unchanged. No overt pulmonary edema. No acute osseous abnormality.  IMPRESSION: Very low inspiratory volumes with left greater than right bibasilar opacities favored to represent atelectasis. In the setting of acute pulmonary embolus, superimposed pulmonary infarction/reperfusion alveolar  hemorrhage is difficult to exclude entirely.  Possible small left pleural effusion.   Electronically Signed   By: Malachy MoanHeath  McCullough M.D.   On: 02/27/2014 09:15    Scheduled Meds: . morphine  15 mg Oral Q12H  . nicotine  21 mg Transdermal Daily  . sodium chloride  3 mL Intravenous Q12H   Continuous Infusions: . sodium chloride 75 mL/hr at 02/27/14 1939  . heparin 2,000 Units/hr (02/27/14 2356)      Time spent: 25 minutes    Gearldene Fiorenza  [facility] Pager [phone] If 7PM-7AM, please contact night-coverage at [email or web address], password [email or web address] 02/28/2014, 9:08 AM  LOS: 1 day

## 2014-02-28 NOTE — Progress Notes (Signed)
ANTICOAGULATION CONSULT NOTE - Initial Consult  Pharmacy Consult for Xarelto Indication: VTE  No Known Allergies  Patient Measurements: Height: 6\' 1"  (185.4 cm) Weight: 229 lb 3.2 oz (103.964 kg) IBW/kg (Calculated) : 79.9 Heparin Dosing Weight:   Vital Signs: Temp: 99.2 F (37.3 C) (10/10 0546) Temp Source: Oral (10/10 0546) BP: 107/70 mmHg (10/10 0546) Pulse Rate: 104 (10/10 0546)  Labs:  Recent Labs  02/26/14 2340 02/27/14 1129 02/27/14 1229 02/27/14 1823 02/27/14 2025 02/27/14 2257 02/28/14 0226 02/28/14 0640  HGB 13.4  --   --   --   --   --  12.3*  --   HCT 39.2  --   --   --   --   --  36.9*  --   PLT 285  --   --   --   --   --  254  --   APTT  --   --  45*  --   --  56*  --  75*  HEPARINUNFRC  --   --  1.09*  --  0.34  --   --  0.33  CREATININE 0.87  --   --   --   --   --  0.87  --   TROPONINI  --  <0.30  --  <0.30  --  <0.30  --   --     Estimated Creatinine Clearance: 141.5 ml/min (by C-G formula based on Cr of 0.87).   Medical History: History reviewed. No pertinent past medical history.   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 4d ago pt had similar calf pain. Was seen at [facility] 10[facility]8 for CP where CT revealed PE. Given one dose of Xarelto + Rx for Xarelto BID, [facility]/o worsening CP/SOB despite Xarelto.  Anticoagulation: new repeat DVT/PE: Heparin >>Xarelto  Pulmonary: Tobacco 1.5ppd   Plan:  Xarelto 15mg  BID x 21d, then start Xarelto 20mg /day.    Chosen Geske S. Merilynn Finlandobertson, PharmD, BCPS Clinical Staff Pharmacist Pager [phone]  Misty Stanleyobertson, Calvina Liptak Stillinger 02/28/2014,12:31 PM

## 2014-02-28 NOTE — Progress Notes (Signed)
ANTICOAGULATION CONSULT NOTE - Follow Up Consult  Pharmacy Consult for Heparin Indication: pulmonary embolus  No Known Allergies  Patient Measurements: Height: 6\' 1"  (185.4 cm) Weight: 229 lb 3.2 oz (103.964 kg) IBW/kg (Calculated) : 79.9 Heparin Dosing Weight: 101.2 kg  Vital Signs: Temp: 99.2 F (37.3 C) (10/10 0546) Temp Source: Oral (10/10 0546) BP: 107/70 mmHg (10/10 0546) Pulse Rate: 104 (10/10 0546)  Labs:  Recent Labs  02/26/14 2340 02/27/14 1129 02/27/14 1229 02/27/14 1823 02/27/14 2025 02/27/14 2257 02/28/14 0226 02/28/14 0640  HGB 13.4  --   --   --   --   --  12.3*  --   HCT 39.2  --   --   --   --   --  36.9*  --   PLT 285  --   --   --   --   --  254  --   APTT  --   --  45*  --   --  56*  --  75*  HEPARINUNFRC  --   --  1.09*  --  0.34  --   --  0.33  CREATININE 0.87  --   --   --   --   --  0.87  --   TROPONINI  --  <0.30  --  <0.30  --  <0.30  --   --     Estimated Creatinine Clearance: 141.5 ml/min (by C-G formula based on Cr of 0.87).   Medications:  Scheduled:  . morphine  15 mg Oral Q12H  . nicotine  21 mg Transdermal Daily  . sodium chloride  3 mL Intravenous Q12H   Infusions:  . sodium chloride 75 mL/hr at 02/27/14 1939  . 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  He has a history of DVT in the left leg four years ago.  He was admitted for further evaluation and treatment.  Pharmacy was consulted to initiate and dose heparin.  Today his heparin level is therapeutic at 0.33, aPTT 75.  Of note, the patient's heparin level may be slightly affected by his Xarelto but this should minimal at this time point in time.  H/H have decreased slightly from yesterday, will continue to monitor for signs and symptoms of bleeding.    Goal of Therapy:  Heparin level 0.3-0.7 units/ml aPTT 66-102 seconds    Plan:  - Continue Heparin at 2000 units/hr - Heparin level in AM - Monitor for s/sx of bleeding - F/U daily CBC  Red ChristiansSamson Kaidan Spengler, Pharm. D. Clinical Pharmacy Resident Pager: [phone] Ph: [phone] 02/28/2014 7:59 AM

## 2014-02-28 NOTE — Discharge Instructions (Addendum)
Information on my medicine - XARELTO (rivaroxaban)  This medication education was reviewed with me or my healthcare representative as part of my discharge preparation.  The pharmacist that spoke with me during my hospital stay was:  Pasty Spillers, [facility]  WHY WAS Robert Ford PRESCRIBED FOR YOU? Xarelto was prescribed to treat blood clots that may have been found in the veins of your legs (deep vein thrombosis) or in your lungs (pulmonary embolism) and to reduce the risk of them occurring again.  What do you need to know about Xarelto? The starting dose is one 15 mg tablet taken TWICE daily with food for the FIRST 21 DAYS then on (enter date)  10/31  the dose is changed to one 20 mg tablet taken ONCE A DAY with your evening meal.  DO NOT stop taking Xarelto without talking to the health care provider who prescribed the medication.  Refill your prescription for 20 mg tablets before you run out.  After discharge, you should have regular check-up appointments with your healthcare provider that is prescribing your Xarelto.  In the future your dose may need to be changed if your kidney function changes by a significant amount.  What do you do if you miss a dose? If you are taking Xarelto TWICE DAILY and you miss a dose, take it as soon as you remember. You may take two 15 mg tablets (total 30 mg) at the same time then resume your regularly scheduled 15 mg twice daily the next day.  If you are taking Xarelto ONCE DAILY and you miss a dose, take it as soon as you remember on the same day then continue your regularly scheduled once daily regimen the next day. Do not take two doses of Xarelto at the same time.   Important Safety Information Xarelto is a blood thinner medicine that can cause bleeding. You should call your healthcare provider right away if you experience any of the following:   Bleeding from an injury or your nose that does not stop.   Unusual colored urine (red or dark  brown) or unusual colored stools (red or black).   Unusual bruising for unknown reasons.   A serious fall or if you hit your head (even if there is no bleeding).  Some medicines may interact with Xarelto and might increase your risk of bleeding while on Xarelto. To help avoid this, consult your healthcare provider or pharmacist prior to using any new prescription or non-prescription medications, including herbals, vitamins, non-steroidal anti-inflammatory drugs (NSAIDs) and supplements.  This website has more information on Xarelto: VisitDestination.com.br.   Pulmonary Embolism A pulmonary (lung) embolism (PE) is a blood clot that has traveled to the lung and results in a blockage of blood flow in the affected lung. Most clots come from deep veins in the legs or pelvis. PE is a dangerous and potentially life-threatening condition that can be treated if identified. CAUSES Blood clots form in a vein for different reasons. Usually several things cause blood clots. They include:  The flow of blood slows down.  The inside of the vein is damaged in some way.  The person has a condition that makes the blood clot more easily. RISK FACTORS Some people are more likely than others to develop PE. Risk factors include:   Smoking.  Being overweight (obese).  Sitting or lying still for a long time. This includes long-distance travel, paralysis, or recovery from an illness or surgery. Other factors that increase risk are:   Older age,  especially over 69 years of age.  Having a family history of blood clots or if you have already had a blood clot.  Having major or lengthy surgery. This is especially true for surgery on the hip, knee, or belly (abdomen). Hip surgery is particularly high risk.  Having a long, thin tube (catheter) placed inside a vein during a medical procedure.  Breaking a hip or leg.  Having cancer or cancer treatment.  Medicines containing the male hormone estrogen. This  includes birth control pills and hormone replacement therapy.  Other circulation or heart problems.  Pregnancy and childbirth.  Hormone changes make the blood clot more easily during pregnancy.  The fetus puts pressure on the veins of the pelvis.  There is a risk of injury to veins during delivery or a caesarean delivery. The risk is highest just after childbirth.  PREVENTION   Exercise the legs regularly. Take a brisk 30 minute walk every day.  Maintain a weight that is appropriate for your height.  Avoid sitting or lying in bed for long periods of time without moving your legs.  Women, particularly those over the age of 35 years, should consider the risks and benefits of taking estrogen medicines, including birth control pills.  Do not smoke, especially if you take estrogen medicines.  Long-distance travel can increase your risk. You should exercise your legs by walking or pumping the muscles every hour.  Many of the risk factors above relate to situations that exist with hospitalization, either for illness, injury, or elective surgery. Prevention may include medical and nonmedical measures.   Your health care provider will assess you for the need for venous thromboembolism prevention when you are admitted to the hospital. If you are having surgery, your surgeon will assess you the day of or day after surgery.  SYMPTOMS  The symptoms of a PE usually start suddenly and include:  Shortness of breath.  Coughing.  Coughing up blood or blood-tinged mucus.  Chest pain. Pain is often worse with deep breaths.  Rapid heartbeat. DIAGNOSIS  If a PE is suspected, your health care provider will take a medical history and perform a physical exam. Other tests that may be required include:  Blood tests, such as studies of the clotting properties of your blood.  Imaging tests, such as ultrasound, CT, MRI, and other tests to see if you have clots in your legs or lungs.  An  electrocardiogram. This can look for heart strain from blood clots in the lungs. TREATMENT   The most common treatment for a PE is blood thinning (anticoagulant) medicine, which reduces the blood's tendency to clot. Anticoagulants can stop new blood clots from forming and old clots from growing. They cannot dissolve existing clots. Your body does this by itself over time. Anticoagulants can be given by mouth, through an intravenous (IV) tube, or by injection. Your health care provider will determine the best program for you.  Less commonly, clot-dissolving medicines (thrombolytics) are used to dissolve a PE. They carry a high risk of bleeding, so they are used mainly in severe cases.  Very rarely, a blood clot in the leg needs to be removed surgically.  If you are unable to take anticoagulants, your health care provider may arrange for you to have a filter placed in a main vein in your abdomen. This filter prevents clots from traveling to your lungs. HOME CARE INSTRUCTIONS   Take all medicines as directed by your health care provider.  Learn as much as you  can about DVT.  Wear a medical alert bracelet or carry a medical alert card.  Ask your health care provider how soon you can go back to normal activities. It is important to stay active to prevent blood clots. If you are on anticoagulant medicine, avoid contact sports.  It is very important to exercise. This is especially important while traveling, sitting, or standing for long periods of time. Exercise your legs by walking or by tightening and relaxing your leg muscles regularly. Take frequent walks.  You may need to wear compression stockings. These are tight elastic stockings that apply pressure to the lower legs. This pressure can help keep the blood in the legs from clotting. Taking Warfarin Warfarin is a daily medicine that is taken by mouth. Your health care provider will advise you on the length of treatment (usually 3-6 months,  sometimes lifelong). If you take warfarin:  Understand how to take warfarin and foods that can affect how warfarin works in Public relations account executiveyour body.  Too much and too little warfarin are both dangerous. Too much warfarin increases the risk of bleeding. Too little warfarin continues to allow the risk for blood clots. Warfarin and Regular Blood Testing While taking warfarin, you will need to have regular blood tests to measure your blood clotting time. These blood tests usually include both the prothrombin time (PT) and international normalized ratio (INR) tests. The PT and INR results allow your health care provider to adjust your dose of warfarin. It is very important that you have your PT and INR tested as often as directed by your health care provider.  Warfarin and Your Diet Avoid major changes in your diet, or notify your health care provider before changing your diet. Arrange a visit with a registered dietitian to answer your questions. Many foods, especially foods high in vitamin K, can interfere with warfarin and affect the PT and INR results. You should eat a consistent amount of foods high in vitamin K. Foods high in vitamin K include:   Spinach, kale, broccoli, cabbage, collard and turnip greens, Brussels sprouts, peas, cauliflower, seaweed, and parsley.  Beef and pork liver.  Green tea.  Soybean oil. Warfarin with Other Medicines Many medicines can interfere with warfarin and affect the PT and INR results. You must:  Tell your health care provider about any and all medicines, vitamins, and supplements you take, including aspirin and other over-the-counter anti-inflammatory medicines. Be especially cautious with aspirin and anti-inflammatory medicines. Ask your health care provider before taking these.  Do not take or discontinue any prescribed or over-the-counter medicine except on the advice of your health care provider or pharmacist. Warfarin Side Effects Warfarin can have side effects, such  as easy bruising and difficulty stopping bleeding. Ask your health care provider or pharmacist about other side effects of warfarin. You will need to:  Hold pressure over cuts for longer than usual.  Notify your dentist and other health care providers that you are taking warfarin before you undergo any procedures where bleeding may occur. Warfarin with Alcohol and Tobacco   Drinking alcohol frequently can increase the effect of warfarin, leading to excess bleeding. It is best to avoid alcoholic drinks or consume only very small amounts while taking warfarin. Notify your health care provider if you change your alcohol intake.  Do not use any tobacco products including cigarettes, chewing tobacco, or electronic cigarettes. If you smoke, quit. Ask your health care provider for help with quitting smoking. Alternative Medicines to Warfarin: Factor Xa Inhibitor Medicines  These blood thinning medicines are taken by mouth, usually for several weeks or longer. It is important to take the medicine every single day, at the same time each day.  There are no regular blood tests required when using these medicines.  There are fewer food and drug interactions than with warfarin.  The side effects of this class of medicine is similar to that of warfarin, including excessive bruising or bleeding. Ask your health care provider or pharmacist about other potential side effects. SEEK MEDICAL CARE IF:   You notice a rapid heartbeat.  You feel weaker or more tired than usual.  You feel faint.  You notice increased bruising.  Your symptoms are not getting better in the time expected.  You are having side effects of medicine. SEEK IMMEDIATE MEDICAL CARE IF:   You have chest pain.  You have trouble breathing.  You have new or increased swelling or pain in one leg.  You cough up blood.  You notice blood in vomit, in a bowel movement, or in urine.  You have a fever. Symptoms of PE may represent a  serious problem that is an emergency. Do not wait to see if the symptoms will go away. Get medical help right away. Call your local emergency services (911 in the [place]). Do not drive yourself to the hospital. Document Released: 05/05/2000 Document Revised: 09/22/2013 Document Reviewed: 05/19/2013 [facility] Patient Information 2015 [employer] [place]. This information is not intended to replace advice given to you by your health care provider. Make sure you discuss any questions you have with your health care provider.

## 2014-03-01 DIAGNOSIS — R091 Pleurisy: Secondary | ICD-10-CM

## 2014-03-01 DIAGNOSIS — I471 Supraventricular tachycardia: Secondary | ICD-10-CM

## 2014-03-01 DIAGNOSIS — R0902 Hypoxemia: Secondary | ICD-10-CM

## 2014-03-01 LAB — CBC
HCT: 38.3 % — ABNORMAL LOW (ref 39.0–52.0)
Hemoglobin: 12.2 g/dL — ABNORMAL LOW (ref 13.0–17.0)
MCH: 30.9 pg (ref 26.0–34.0)
MCHC: 31.9 g/dL (ref 30.0–36.0)
MCV: 97 fL (ref 78.0–100.0)
PLATELETS: 278 10*3/uL (ref 150–400)
RBC: 3.95 MIL/uL — ABNORMAL LOW (ref 4.22–5.81)
RDW: 12.8 % (ref 11.5–15.5)
WBC: 8.7 10*3/uL (ref 4.0–10.5)

## 2014-03-01 LAB — PROCALCITONIN

## 2014-03-01 MED ORDER — RIVAROXABAN 15 MG PO TABS
15.0000 mg | ORAL_TABLET | Freq: Two times a day (BID) | ORAL | Status: AC
Start: 2014-03-01 — End: 2014-03-20

## 2014-03-01 MED ORDER — HYDROCODONE-ACETAMINOPHEN 10-325 MG PO TABS: 1.0000 | ORAL_TABLET | ORAL | Status: AC | PRN

## 2014-03-01 MED ORDER — RIVAROXABAN 20 MG PO TABS: 20.0000 mg | ORAL_TABLET | Freq: Every day | ORAL | Status: AC

## 2014-03-01 MED ORDER — NICOTINE 21 MG/24HR TD PT24: 21.0000 mg | MEDICATED_PATCH | Freq: Every day | TRANSDERMAL | Status: AC

## 2014-03-01 NOTE — Discharge Summary (Signed)
Physician Discharge Summary  Robert Ford NRW:[id number] DOB: 26-Aug-1970 DOA: 02/27/2014  PCP: No primary provider on file.  Admit date: 02/27/2014 Discharge date: 03/01/2014  Time spent: 35 minutes  Recommendations for Outpatient Follow-up:  1. discharge home on xarelto for at least 6 months of treatment and given episode of acute PE with prior DVT few years back, he may need long term anticoagulation. 2.  patient needs to see a PCP in [place] for follow up. Please check hypercoagulable w/up once off anticoagulation   Discharge Diagnoses:  Principal Problem:   Pulmonary embolism  Active Problems:   Smoking   History of DVT of lower extremity   Sinus tachycardia   Pleurisy   Hypoxia   Discharge Condition: fair  Diet recommendation: regular  Filed Weights   02/27/14 0527 02/27/14 0652 02/28/14 0546  Weight: 95.255 kg (210 lb) 104.055 kg (229 lb 6.4 oz) 103.964 kg (229 lb 3.2 oz)    History of present illness:  43 y.o. male With PMH of left leg DVT, smoking, newly diagnosed PE, who presents with worsening shortness of breath and chest pain.  Patient is a [employer]. 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 He had a DVT in left leg 4 years ago, which was treated with Coumadin for 5 months. 4 days ago, he felt pressure in the calf area over his left leg, but he did not pay attention to it. 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 On 02/26/2014,  he started having severe chest pain and  shortness of breath. He did not have cough. His chest pain was 10 out of 10 in severity, pluritic, located at the left lower back and chest. It was  aggravated by deep breathing and movement. He was brought to [facility] ED in [place] , [place]. He was diagnosed with pulmonary embolism. He was treated with heparin and given one dose of Xarelto in ED, then discharged on Xarelto and pain medications. In the afternoon, he felt  that his chest pain and SOB to be  Worse and thus returned to [facility]  emergency room in [place], [place] for further evaluation and treatment.  Of note, his CTA in [place] hospital showed a small Lt sided pulmonary embolism without evidence of right heart straining. Likely small pulmonary infarcts in left lower lobe is identified. Lower extremity venous Doppler ultrasound was done, which did not show DVT. In the ED patient was tachycardic with normal BP. His O2 saturation decreased to 80% on ambulation. He was admitted to inpatient for further evaluation and treatment.      Hospital Course:  Acute chest pain and hypoxia with pulmonary embolism:  CT angio done at [facility] on 10/8 mentions small left lower lung blood clot without right heart strain. Patient has  pleuritic chest pain related to pulmonary infarction and hypoxia due to atelectasis.  - 2D echo done with normal EF and no Wall motion abnormality or rt heart strain.  -patient placed on scheduled and prn narcotics, supportive care with fluids and oxygen. His chest pain now completely resolved, still tachycardic but not dyspneic anymore and sats stable on RA.  -check 12 lead EKG shows sinus tachycardia in low 100s.. Patient was initially started on IV heparin but then  Transitioned to xarelto  - factor V Ley[place] pending, homocystine level and thrombin III negative. ( may not show clear resumt as he si already on [facility]), has hx of  DVT 4 years back treated with [facility] in [place] for 5 months.  doppler LE done at [facility] negative for DVT.  - serial troponin negative ,  CXR showing left atalectasis.  -patient stable for discharge home. He will be on oral xarelto 15 mg bid for 21 days followed by 20 mg daily. 1 month prescription has been provided with xarelto kit. He is supposed to see a doctor in [place] on 10/13. i have advised him to see a regular MD for his follow up. -patient has prn oxycodine for chest pain prescribed from ED in  [facility] .   Tobacco use  2 PPD for more than 20 years. counseled on cessation  - nicotine patch ordered   -   Code Status: Full code   Family Communication: wife at bedside   Disposition Plan: d/c home    Consultants:  None    Procedures:  2D echo   Antibiotics:  NONE    Discharge Exam: Filed Vitals:   03/01/14 0520  BP: 115/79  Pulse: 89  Temp: 98.9 F (37.2 C)  Resp: 18    General: NAD  HEENT: no pallor, moist mucosa Chest: Clear b/l  Cardiovascular: s1&s2 tachycardic, no murmurs  Abdomen: soft, ND, NT, BS+  Musculoskeletal: warm, no edema CNS: alert and oriented   Discharge Instructions You were cared for by a hospitalist during your hospital stay. If you have any questions about your discharge medications or the care you received while you were in the hospital after you are discharged, you can call the unit and asked to speak with the hospitalist on call if the hospitalist that took care of you is not available. Once you are discharged, your primary care physician will handle any further medical issues. Please note that NO REFILLS for any discharge medications will be authorized once you are discharged, as it is imperative that you return to your primary care physician (or establish a relationship with a primary care physician if you do not have one) for your aftercare needs so that they can reassess your need for medications and monitor your lab values.   Current Discharge Medication List    START taking these medications   Details       nicotine (NICODERM CQ - DOSED IN MG/24 HOURS) 21 mg/24hr patch Place 1 patch (21 mg total) onto the skin daily. Qty: 28 patch, Refills: 0    !! Rivaroxaban (XARELTO) 15 MG TABS tablet Take 1 tablet (15 mg total) by mouth 2 (two) times daily with a meal. Qty: 42 tablet, Refills: 0 until 03/20/2014    !! rivaroxaban (XARELTO) 20 MG TABS tablet Take 1 tablet (20 mg total) by mouth daily with supper. Qty: 30  tablet, Refills: 0 start taking from 03/21/2014         No Known Allergies Follow-up Information   Please follow up. (follow up with outatiet PCP in [place])        The results of significant diagnostics from this hospitalization (including imaging, microbiology, ancillary and laboratory) are listed below for reference.    Significant Diagnostic Studies: Dg Chest Port 1 View  02/27/2014   CLINICAL DATA:  Hypoxia  EXAM: PORTABLE CHEST - 1 VIEW  COMPARISON:  CT scan of the chest 02/26/2014  FINDINGS: Low inspiratory volumes with left greater than right bibasilar patchy airspace opacities and evidence of a superimposed volume loss. Is difficult to exclude a small left pleural effusion. The cardiac and mediastinal contours remain unchanged. No overt pulmonary  edema. No acute osseous abnormality.  IMPRESSION: Very low inspiratory volumes with left greater than right bibasilar opacities favored to represent atelectasis. In the setting of acute pulmonary embolus, superimposed pulmonary infarction/reperfusion alveolar hemorrhage is difficult to exclude entirely.  Possible small left pleural effusion.   Electronically Signed   By: Jacqulynn Cadet M.D.   On: 02/27/2014 09:15    Microbiology: No results found for this or any previous visit (from the past 240 hour(s)).   Labs: Basic Metabolic Panel:  Recent Labs Lab 02/26/14 2340 02/28/14 0226  NA 138 138  K 4.9 4.6  CL 99 100  CO2 26 23  GLUCOSE 114* 112*  BUN 15 11  CREATININE 0.87 0.87  CALCIUM 9.0 8.6   Liver Function Tests:  Recent Labs Lab 02/28/14 0226  AST 11  ALT 7  ALKPHOS 50  BILITOT 0.3  PROT 6.7  ALBUMIN 3.0*   No results found for this basename: LIPASE, AMYLASE,  in the last 168 hours No results found for this basename: AMMONIA,  in the last 168 hours CBC:  Recent Labs Lab 02/26/14 2340 02/28/14 0226 03/01/14 0403  WBC 12.7* 12.9* 8.7  HGB 13.4 12.3* 12.2*  HCT 39.2 36.9* 38.3*  MCV 92.0 93.7 97.0   PLT 285 254 278   Cardiac Enzymes:  Recent Labs Lab 02/27/14 1129 02/27/14 1823 02/27/14 2257  TROPONINI <0.30 <0.30 <0.30   BNP: BNP (last 3 results)  Recent Labs  02/27/14 1229  PROBNP 38.1   CBG: No results found for this basename: GLUCAP,  in the last 168 hours     Signed:  Hema Lanza, Ricardo  [facility] 03/01/2014, 9:02 AM

## 2014-03-01 NOTE — Progress Notes (Signed)
Discharged to home with family office visits in place teaching done  

## 2014-03-03 LAB — FACTOR 5 LEIDEN

## 2015-04-03 IMAGING — CT CT ANGIO CHEST
2 of 6 series · 18 of 36 positions shown · IV contrast (APPLIED)
Comparison: None.

CLINICAL DATA: Shortness of breath and chest pain which began
02/25/2014 and has worsened today.

EXAM:
CT ANGIOGRAPHY CHEST WITH CONTRAST
TECHNIQUE: Multidetector CT imaging of the chest was performed using the
standard protocol during bolus administration of intravenous
contrast. Multiplanar CT image reconstructions and MIPs were
obtained to evaluate the vascular anatomy.
CONTRAST:  100 cc Isovue 370.

[Series 5: pe 1.0 thins · axial · 0.60mm/px · z∈[-656,-428]mm · 17 of 257 slices shown]
[im 15/257  lung]
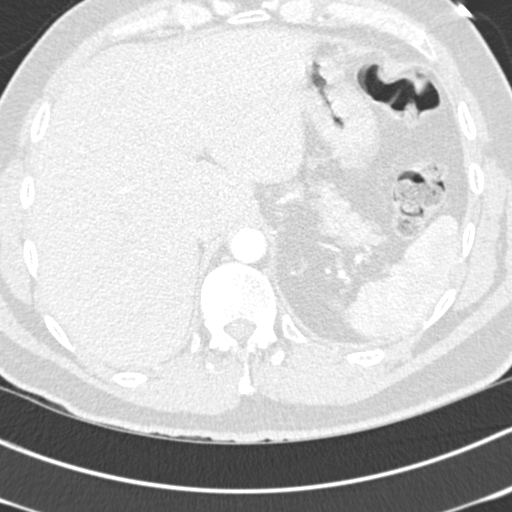
[im 29/257  mediastinal]
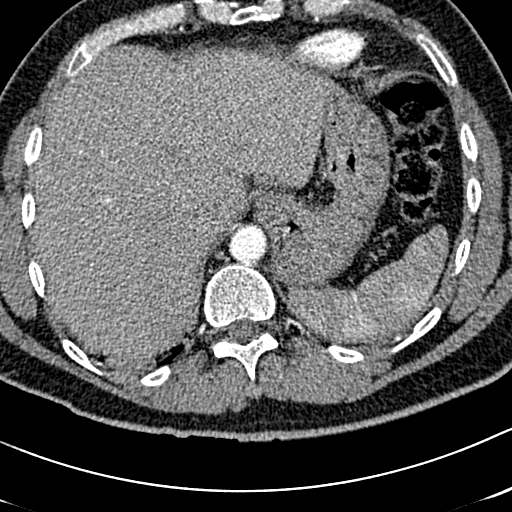
[im 43/257  lung]
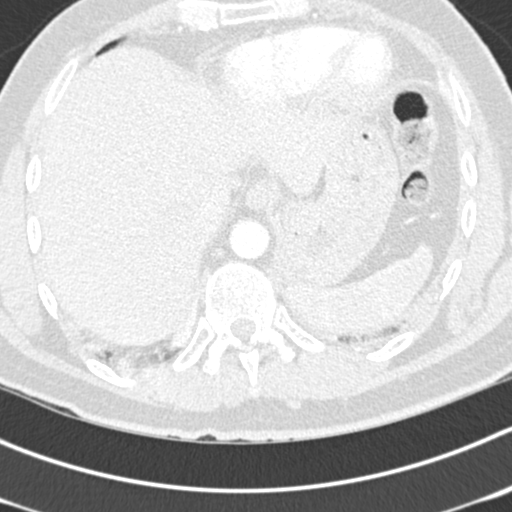
[im 57/257  mediastinal]
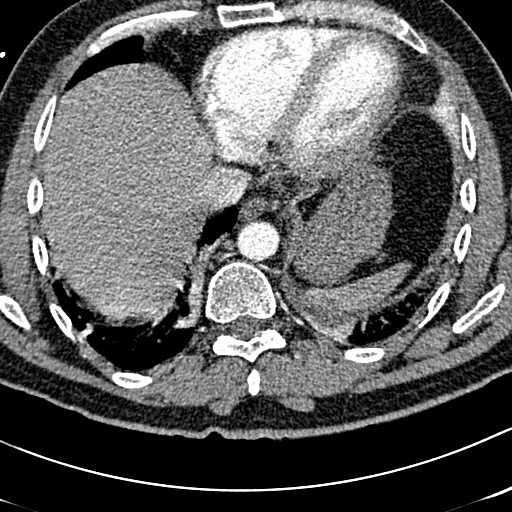
[im 72/257  lung]
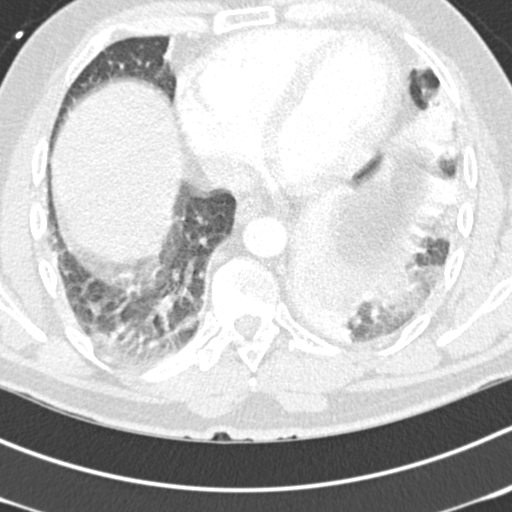
[im 86/257  mediastinal]
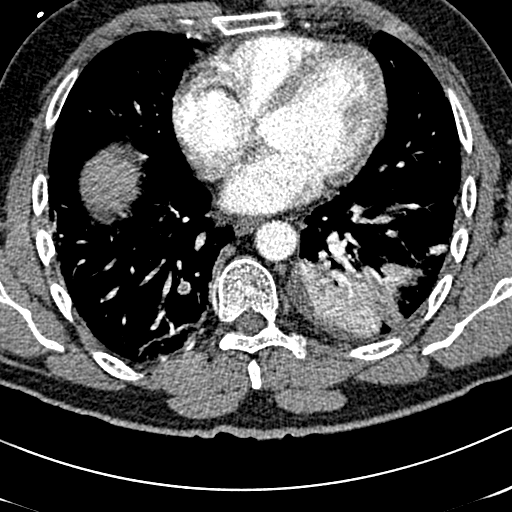
[im 100/257  lung]
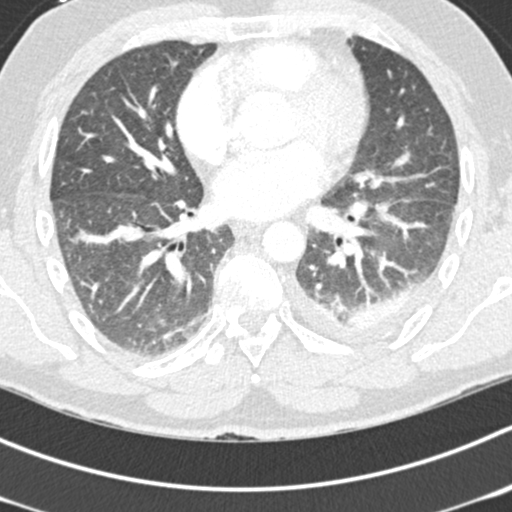
[im 114/257  mediastinal]
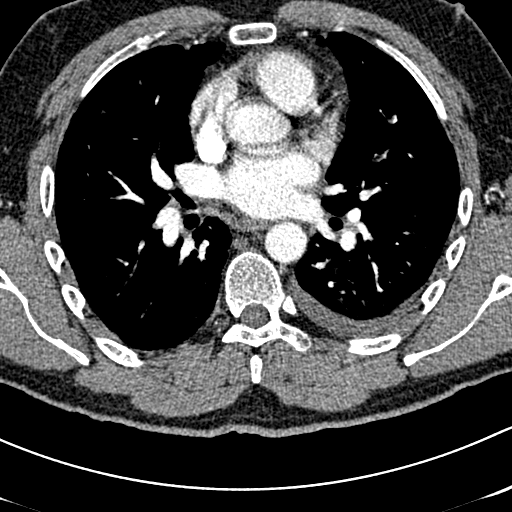
[im 129/257  lung]
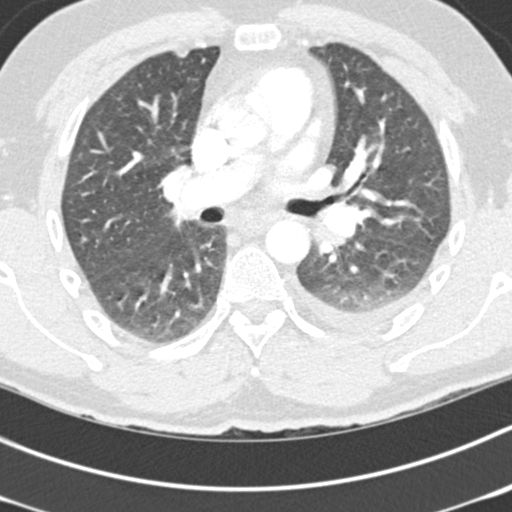
[im 143/257  mediastinal]
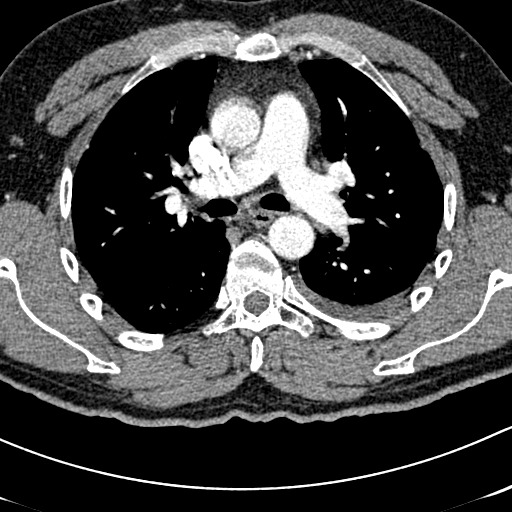
[im 157/257  lung]
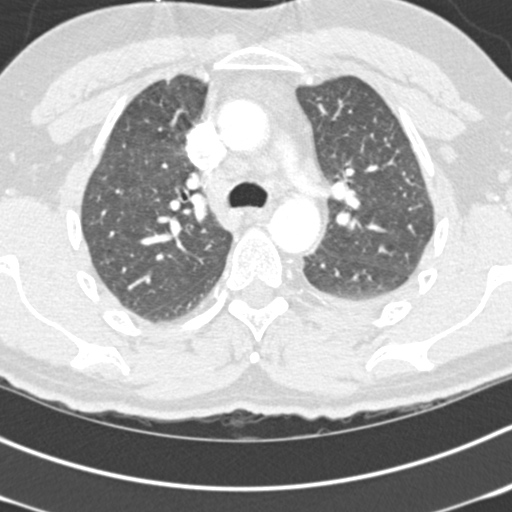
[im 171/257  mediastinal]
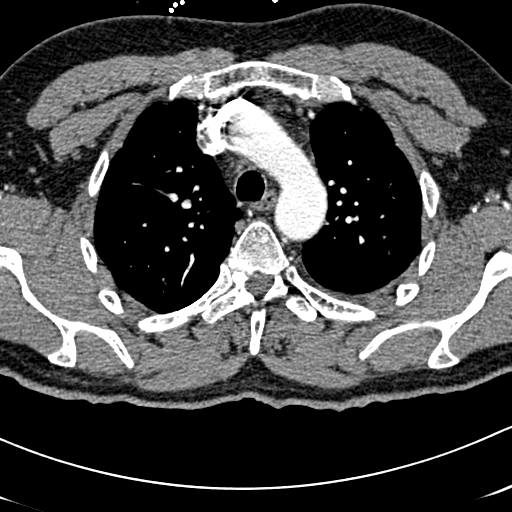
[im 185/257  lung]
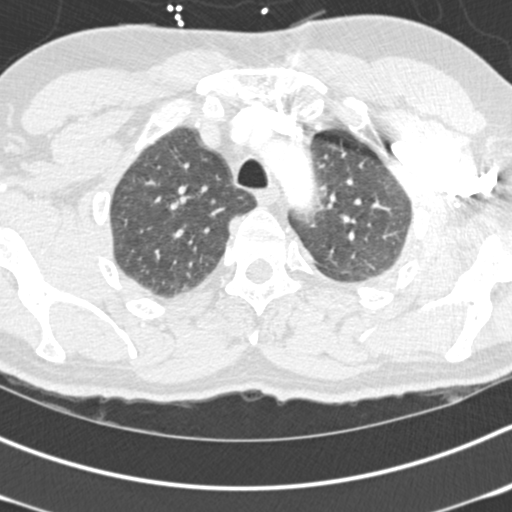
[im 200/257  mediastinal]
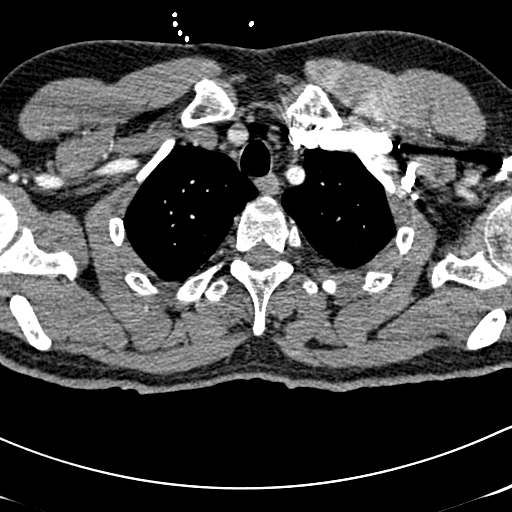
[im 214/257  lung]
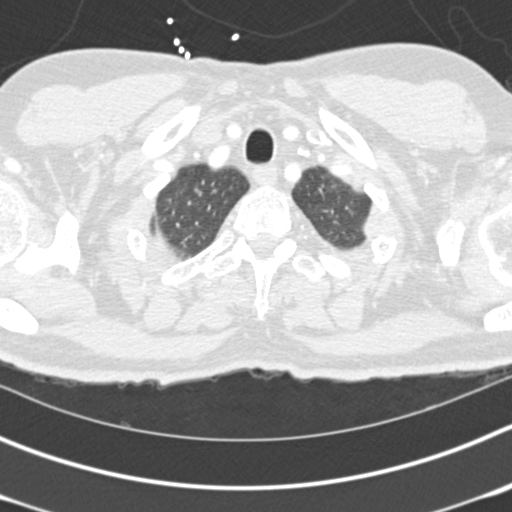
[im 228/257  mediastinal]
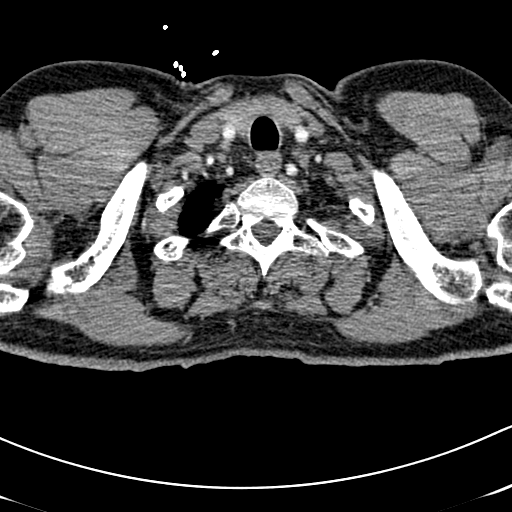
[im 242/257  lung]
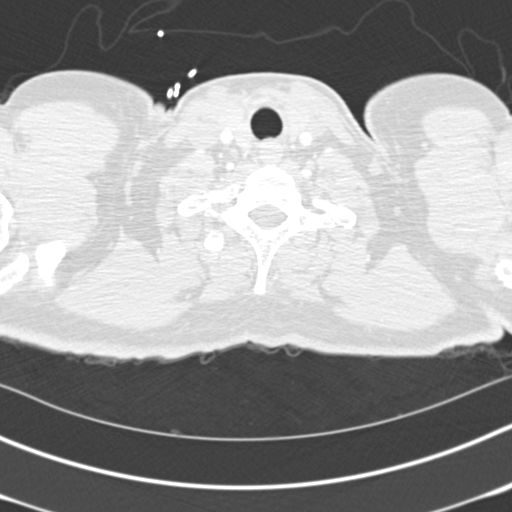

[Series 7: cor pe 2.0 mpr · coronal · 0.50mm/px · 1 of 125 slices shown]
[im 63/125  mediastinal]
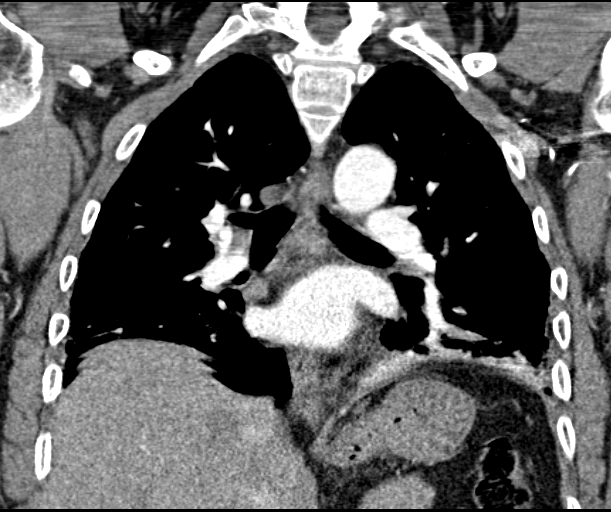

[18 of 36 positions shown; findings below may reference images not displayed]

FINDINGS: The examination is positive for pulmonary embolus with clot best
seen and descending interlobar branches on the right and in an
anterior right upper lobe branch. There is no evidence of right
heart strain. Heart size is upper normal. Small left pleural
effusion is identified. There is no right pleural effusion or
pericardial effusion. Precarinal lymph nodes are identified
measuring 1.0 cm on image 50 to and 0.9 cm on image 51. The nodes
appear to have fatty hila. There is airspace opacity in the medial
aspect of the left lower lobe posteriorly. A small segment of this
opacity does not enhance worrisome for infarct. The majority of the
opacity has an appearance most consistent with atelectasis.

Visualized upper abdomen demonstrates no focal abnormality. No focal
bony abnormality is identified.

Review of the MIP images confirms the above findings.
IMPRESSION: The study is positive for pulmonary emboli without evidence of right
heart strain. Likely small pulmonary infarction left lower lobe is
identified.

Small left pleural effusion.

Critical Value/emergent results were called by telephone at the time
of interpretation on 02/26/2014 at [DATE] to Dr. INAIARA MAVIGNIER ,
who verbally acknowledged these results.
